# Patient Record
Sex: Male | Born: 1970 | Race: White | Hispanic: No | Smoking: Former smoker
Health system: Southern US, Community
[De-identification: ages and names within clinical notes are randomized; demographics above are authoritative.]

## PROBLEM LIST (undated history)

## (undated) DIAGNOSIS — M1990 Unspecified osteoarthritis, unspecified site: Secondary | ICD-10-CM

## (undated) DIAGNOSIS — G709 Myoneural disorder, unspecified: Secondary | ICD-10-CM

## (undated) HISTORY — PX: KNEE ARTHROSCOPY: SUR90

## (undated) HISTORY — PX: CERVICAL FUSION: SHX112

---

## 2009-03-14 ENCOUNTER — Ambulatory Visit (HOSPITAL_COMMUNITY): Admission: RE | Admit: 2009-03-14 | Discharge: 2009-03-14 | Payer: Self-pay | Admitting: Neurosurgery

## 2010-05-04 LAB — CBC
Hemoglobin: 16.4 g/dL (ref 13.0–17.0)
RBC: 5.12 MIL/uL (ref 4.22–5.81)
WBC: 7.9 10*3/uL (ref 4.0–10.5)

## 2012-10-30 ENCOUNTER — Other Ambulatory Visit: Payer: Self-pay | Admitting: Neurosurgery

## 2012-10-30 DIAGNOSIS — M4712 Other spondylosis with myelopathy, cervical region: Secondary | ICD-10-CM

## 2012-10-31 ENCOUNTER — Other Ambulatory Visit: Payer: Self-pay | Admitting: Neurosurgery

## 2012-10-31 DIAGNOSIS — Z77018 Contact with and (suspected) exposure to other hazardous metals: Secondary | ICD-10-CM

## 2012-11-02 ENCOUNTER — Ambulatory Visit
Admission: RE | Admit: 2012-11-02 | Discharge: 2012-11-02 | Disposition: A | Discharge status: Home / Self Care | Payer: Worker's Compensation | Source: Ambulatory Visit | Attending: Neurosurgery | Admitting: Neurosurgery

## 2012-11-02 ENCOUNTER — Other Ambulatory Visit: Payer: Self-pay

## 2012-11-02 DIAGNOSIS — Z77018 Contact with and (suspected) exposure to other hazardous metals: Secondary | ICD-10-CM

## 2012-11-02 DIAGNOSIS — M4712 Other spondylosis with myelopathy, cervical region: Secondary | ICD-10-CM

## 2012-11-29 ENCOUNTER — Other Ambulatory Visit: Payer: Self-pay | Admitting: Neurosurgery

## 2012-11-29 DIAGNOSIS — M4712 Other spondylosis with myelopathy, cervical region: Secondary | ICD-10-CM

## 2012-12-08 ENCOUNTER — Ambulatory Visit
Admission: RE | Admit: 2012-12-08 | Discharge: 2012-12-08 | Disposition: A | Discharge status: Home / Self Care | Payer: Worker's Compensation | Source: Ambulatory Visit | Attending: Neurosurgery | Admitting: Neurosurgery

## 2012-12-08 VITALS — BP 132/65 | HR 68

## 2012-12-08 DIAGNOSIS — M4712 Other spondylosis with myelopathy, cervical region: Secondary | ICD-10-CM

## 2012-12-08 MED ORDER — DIAZEPAM 5 MG PO TABS
10.0000 mg | ORAL_TABLET | Freq: Once | ORAL | Status: AC
  Administered 2012-12-08: 10 mg via ORAL

## 2012-12-08 MED ORDER — MEPERIDINE HCL 100 MG/ML IJ SOLN
100.0000 mg | Freq: Once | INTRAMUSCULAR | Status: AC
  Administered 2012-12-08: 100 mg via INTRAMUSCULAR

## 2012-12-08 MED ORDER — ONDANSETRON HCL 4 MG/2ML IJ SOLN
4.0000 mg | Freq: Once | INTRAMUSCULAR | Status: AC
  Administered 2012-12-08: 4 mg via INTRAMUSCULAR

## 2012-12-08 MED ORDER — IOHEXOL 300 MG/ML  SOLN
10.0000 mL | Freq: Once | INTRAMUSCULAR | Status: AC | PRN
  Administered 2012-12-08: 10 mL via INTRAVENOUS

## 2014-05-21 ENCOUNTER — Other Ambulatory Visit: Payer: Self-pay | Admitting: Neurosurgery

## 2014-05-27 MED ORDER — CEFAZOLIN SODIUM-DEXTROSE 2-3 GM-% IV SOLR
2.0000 g | INTRAVENOUS | Status: AC
  Administered 2014-05-28: 2 g via INTRAVENOUS

## 2014-05-28 ENCOUNTER — Encounter (HOSPITAL_COMMUNITY)
Admission: RE | Disposition: A | Discharge status: Home / Self Care | Payer: Self-pay | Source: Ambulatory Visit | Attending: Neurosurgery

## 2014-05-28 ENCOUNTER — Ambulatory Visit (HOSPITAL_COMMUNITY)
Admission: RE | Admit: 2014-05-28 | Discharge: 2014-05-28 | Disposition: A | Discharge status: Home / Self Care | Payer: Worker's Compensation | Source: Ambulatory Visit | Attending: Neurosurgery | Admitting: Neurosurgery

## 2014-05-28 ENCOUNTER — Ambulatory Visit (HOSPITAL_COMMUNITY): Payer: Worker's Compensation | Admitting: Certified Registered Nurse Anesthetist

## 2014-05-28 ENCOUNTER — Encounter (HOSPITAL_COMMUNITY): Payer: Self-pay

## 2014-05-28 DIAGNOSIS — G5601 Carpal tunnel syndrome, right upper limb: Secondary | ICD-10-CM | POA: Insufficient documentation

## 2014-05-28 DIAGNOSIS — M4722 Other spondylosis with radiculopathy, cervical region: Secondary | ICD-10-CM | POA: Insufficient documentation

## 2014-05-28 DIAGNOSIS — Z87891 Personal history of nicotine dependence: Secondary | ICD-10-CM | POA: Insufficient documentation

## 2014-05-28 DIAGNOSIS — G5602 Carpal tunnel syndrome, left upper limb: Secondary | ICD-10-CM | POA: Insufficient documentation

## 2014-05-28 DIAGNOSIS — M4802 Spinal stenosis, cervical region: Secondary | ICD-10-CM | POA: Insufficient documentation

## 2014-05-28 HISTORY — DX: Myoneural disorder, unspecified: G70.9

## 2014-05-28 HISTORY — PX: CARPAL TUNNEL RELEASE: SHX101

## 2014-05-28 HISTORY — DX: Unspecified osteoarthritis, unspecified site: M19.90

## 2014-05-28 SURGERY — CARPAL TUNNEL RELEASE
Anesthesia: Monitor Anesthesia Care | Laterality: Right

## 2014-05-28 MED ORDER — 0.9 % SODIUM CHLORIDE (POUR BTL) OPTIME
TOPICAL | Status: DC | PRN
  Administered 2014-05-28: 1000 mL

## 2014-05-28 MED ORDER — NEOSTIGMINE METHYLSULFATE 10 MG/10ML IV SOLN
INTRAVENOUS | Status: AC
  Filled 2014-05-28: qty 1

## 2014-05-28 MED ORDER — FENTANYL CITRATE 0.05 MG/ML IJ SOLN
INTRAMUSCULAR | Status: AC
  Filled 2014-05-28: qty 5

## 2014-05-28 MED ORDER — OXYCODONE-ACETAMINOPHEN 5-325 MG PO TABS
ORAL_TABLET | ORAL | Status: AC
  Filled 2014-05-28: qty 2

## 2014-05-28 MED ORDER — FENTANYL CITRATE 0.05 MG/ML IJ SOLN
INTRAMUSCULAR | Status: DC | PRN
  Administered 2014-05-28 (×4): 50 ug via INTRAVENOUS

## 2014-05-28 MED ORDER — LIDOCAINE HCL (PF) 1 % IJ SOLN
INTRAMUSCULAR | Status: DC | PRN
  Administered 2014-05-28: 7 mL

## 2014-05-28 MED ORDER — MIDAZOLAM HCL 5 MG/5ML IJ SOLN
INTRAMUSCULAR | Status: DC | PRN
  Administered 2014-05-28: 2 mg via INTRAVENOUS

## 2014-05-28 MED ORDER — PROMETHAZINE HCL 25 MG/ML IJ SOLN: 6.2500 mg | INTRAMUSCULAR | Status: DC | PRN

## 2014-05-28 MED ORDER — LACTATED RINGERS IV SOLN
INTRAVENOUS | Status: DC
  Administered 2014-05-28: 10:00:00 via INTRAVENOUS

## 2014-05-28 MED ORDER — MIDAZOLAM HCL 2 MG/2ML IJ SOLN
INTRAMUSCULAR | Status: AC
  Filled 2014-05-28: qty 2

## 2014-05-28 MED ORDER — PROPOFOL 10 MG/ML IV BOLUS
INTRAVENOUS | Status: DC | PRN
  Administered 2014-05-28: 20 mg via INTRAVENOUS

## 2014-05-28 MED ORDER — LACTATED RINGERS IV SOLN
INTRAVENOUS | Status: DC | PRN
  Administered 2014-05-28: 10:00:00 via INTRAVENOUS

## 2014-05-28 MED ORDER — OXYCODONE-ACETAMINOPHEN 5-325 MG PO TABS
2.0000 | ORAL_TABLET | Freq: Once | ORAL | Status: AC
  Administered 2014-05-28: 2 via ORAL

## 2014-05-28 MED ORDER — HYDROMORPHONE HCL 1 MG/ML IJ SOLN: 0.2500 mg | INTRAMUSCULAR | Status: DC | PRN

## 2014-05-28 MED ORDER — PHENYLEPHRINE HCL 10 MG/ML IJ SOLN
INTRAMUSCULAR | Status: AC
  Filled 2014-05-28: qty 1

## 2014-05-28 MED ORDER — CEFAZOLIN SODIUM-DEXTROSE 2-3 GM-% IV SOLR
INTRAVENOUS | Status: AC
  Filled 2014-05-28: qty 50

## 2014-05-28 MED ORDER — LIDOCAINE HCL (CARDIAC) 20 MG/ML IV SOLN
INTRAVENOUS | Status: AC
  Filled 2014-05-28: qty 5

## 2014-05-28 MED ORDER — PROPOFOL INFUSION 10 MG/ML OPTIME
INTRAVENOUS | Status: DC | PRN
  Administered 2014-05-28: 30 ug/kg/min via INTRAVENOUS

## 2014-05-28 MED ORDER — GLYCOPYRROLATE 0.2 MG/ML IJ SOLN
INTRAMUSCULAR | Status: AC
  Filled 2014-05-28: qty 2

## 2014-05-28 SURGICAL SUPPLY — 37 items
BANDAGE ELASTIC 4 VELCRO ST LF (GAUZE/BANDAGES/DRESSINGS) ×3 IMPLANT
BANDAGE GAUZE 4  KLING STR (GAUZE/BANDAGES/DRESSINGS) ×3 IMPLANT
BLADE SURG 15 STRL LF DISP TIS (BLADE) ×1 IMPLANT
BLADE SURG 15 STRL SS (BLADE) ×2
BNDG GAUZE ELAST 4 BULKY (GAUZE/BANDAGES/DRESSINGS) ×3 IMPLANT
CORDS BIPOLAR (ELECTRODE) ×3 IMPLANT
DECANTER SPIKE VIAL GLASS SM (MISCELLANEOUS) ×3 IMPLANT
DRAPE EXTREMITY T 121X128X90 (DRAPE) ×3 IMPLANT
DRSG TELFA 3X8 NADH (GAUZE/BANDAGES/DRESSINGS) ×3 IMPLANT
GAUZE SPONGE 4X4 12PLY STRL (GAUZE/BANDAGES/DRESSINGS) ×3 IMPLANT
GAUZE SPONGE 4X4 16PLY XRAY LF (GAUZE/BANDAGES/DRESSINGS) ×3 IMPLANT
GLOVE BIO SURGEON STRL SZ8 (GLOVE) ×3 IMPLANT
GLOVE BIOGEL PI IND STRL 8.5 (GLOVE) ×1 IMPLANT
GLOVE BIOGEL PI INDICATOR 8.5 (GLOVE) ×2
GLOVE EXAM NITRILE LRG STRL (GLOVE) IMPLANT
GLOVE EXAM NITRILE MD LF STRL (GLOVE) IMPLANT
GLOVE EXAM NITRILE XL STR (GLOVE) IMPLANT
GLOVE EXAM NITRILE XS STR PU (GLOVE) IMPLANT
GOWN STRL REUS W/ TWL LRG LVL3 (GOWN DISPOSABLE) IMPLANT
GOWN STRL REUS W/ TWL XL LVL3 (GOWN DISPOSABLE) IMPLANT
GOWN STRL REUS W/TWL 2XL LVL3 (GOWN DISPOSABLE) IMPLANT
GOWN STRL REUS W/TWL LRG LVL3 (GOWN DISPOSABLE)
GOWN STRL REUS W/TWL XL LVL3 (GOWN DISPOSABLE)
KIT BASIN OR (CUSTOM PROCEDURE TRAY) ×3 IMPLANT
KIT ROOM TURNOVER OR (KITS) ×3 IMPLANT
NEEDLE HYPO 25X1 1.5 SAFETY (NEEDLE) ×3 IMPLANT
NS IRRIG 1000ML POUR BTL (IV SOLUTION) ×3 IMPLANT
PACK SURGICAL SETUP 50X90 (CUSTOM PROCEDURE TRAY) ×3 IMPLANT
PAD ARMBOARD 7.5X6 YLW CONV (MISCELLANEOUS) ×9 IMPLANT
STOCKINETTE 4X48 STRL (DRAPES) ×3 IMPLANT
SUT ETHILON 3 0 PS 1 (SUTURE) ×3 IMPLANT
SYR BULB 3OZ (MISCELLANEOUS) ×3 IMPLANT
SYR CONTROL 10ML LL (SYRINGE) ×3 IMPLANT
TOWEL OR 17X24 6PK STRL BLUE (TOWEL DISPOSABLE) ×3 IMPLANT
TOWEL OR 17X26 10 PK STRL BLUE (TOWEL DISPOSABLE) ×3 IMPLANT
UNDERPAD 30X30 INCONTINENT (UNDERPADS AND DIAPERS) ×3 IMPLANT
WATER STERILE IRR 1000ML POUR (IV SOLUTION) ×3 IMPLANT

## 2014-05-28 NOTE — Discharge Summary (Signed)
Physician Discharge Summary  Patient ID: Patsy BaltimoreBilly R Gentile MRN: [id number] DOB/AGE: Oct 09, 1970 44 y.o.  Admit date: 05/28/2014 Discharge date: 05/28/2014  Admission Diagnoses: Right carpal tunnel syndrome  Discharge Diagnoses: Same Active Problems:   * No active hospital problems. *   Discharged Condition: good  Hospital Course: Uncomplicated right carpal tunnel release  Consults: None  Significant Diagnostic Studies: None  Treatments: surgery: right carpal tunnel release  Discharge Exam: Blood pressure 132/66, pulse 63, temperature 98 F (36.7 C), temperature source Oral, resp. rate 13, height 6' (1.829 m), weight 77.111 kg (170 lb), SpO2 98 %. Neurologic: Alert and oriented X 3, normal strength and tone. Normal symmetric reflexes. Normal coordination and gait Wound:CDI  Disposition: Home     Medication List    ASK your doctor about these medications        diazepam 5 MG tablet  Commonly known as:  VALIUM  Take 5 mg by mouth at bedtime as needed (sleep).     morphine 15 MG 12 hr tablet  Commonly known as:  MS CONTIN  Take 15 mg by mouth every 12 (twelve) hours.     oxyCODONE-acetaminophen 5-325 MG per tablet  Commonly known as:  PERCOCET/ROXICET  Take 1 tablet by mouth every 4 (four) hours as needed for severe pain (pain).         Signed: Dorian HeckleSTERN,Shawnee Higham D, MD 05/28/2014, 3:47 PM

## 2014-05-28 NOTE — Brief Op Note (Signed)
05/28/2014  11:49 AM  PATIENT:  Louis Young  44 y.o. male  PRE-OPERATIVE DIAGNOSIS:  Bilateral carpal tunnel syndrome  POST-OPERATIVE DIAGNOSIS: Bilateral carpal tunnel syndrome  PROCEDURE:  Procedure(s) with comments: RIGHT CARPAL TUNNEL RELEASE (Right) - RIGHT CARPAL TUNNEL RELEASE  SURGEON:  Surgeon(s) and Role:    * Maeola HarmanJoseph Edgerrin Correia, MD - Primary  PHYSICIAN ASSISTANT:   ASSISTANTS: none   ANESTHESIA:   IV sedation  EBL:  Total I/O In: 700 [I.V.:700] Out: -   BLOOD ADMINISTERED:none  DRAINS: none   LOCAL MEDICATIONS USED:  LIDOCAINE   SPECIMEN:  No Specimen  DISPOSITION OF SPECIMEN:  N/A  COUNTS:  YES  TOURNIQUET:  * No tourniquets in log *  DICTATION:   Patient was given intravenous sedation and positioned with his right arm on the arm board.  His hand and arm were prepped and draped with betadine scrub and paint and sterile stockinet.  Right wrist was infiltrated with lidocaine and an incision was made over a length of 2 cm in line with the fourth ray.  The flexor retinaculum was incised and the carpal tunnel was decompressed.  Decompression was carried into the volar wrist and into the distal palm.  Hemostasis was assured. The wound was irrigated and closed with 3-0 nylon vertical mattress stitches and dressed with a sterile occlusive dressing with Bacitracin and Telfa, fluff gauze, Kerlix and cling wrap. Patient was taken to recovery in stable and satisfactory condition. Counts were correct at the end of the case.  PLAN OF CARE: Discharge to home after PACU  PATIENT DISPOSITION:  PACU - hemodynamically stable.   Delay start of Pharmacological VTE agent (>24hrs) due to surgical blood loss or risk of bleeding: yes

## 2014-05-28 NOTE — Transfer of Care (Signed)
Immediate Anesthesia Transfer of Care Note  Patient: Louis Young  Procedure(s) Performed: Procedure(s) with comments: RIGHT CARPAL TUNNEL RELEASE (Right) - RIGHT CARPAL TUNNEL RELEASE  Patient Location: PACU  Anesthesia Type:MAC  Level of Consciousness: awake, alert , oriented and patient cooperative  Airway & Oxygen Therapy: Patient Spontanous Breathing and Patient connected to nasal cannula oxygen  Post-op Assessment: Report given to RN and Post -op Vital signs reviewed and stable  Post vital signs: Reviewed  Last Vitals:  Filed Vitals:   05/28/14 0910  BP: 150/84  Pulse: 70  Temp: 36.6 C  Resp: 18    Complications: No apparent anesthesia complications

## 2014-05-28 NOTE — Interval H&P Note (Signed)
History and Physical Interval Note:  05/28/2014 7:17 AM  Louis Young  has presented today for surgery, with the diagnosis of Bilateral carpal tunnel release  The various methods of treatment have been discussed with the patient and family. After consideration of risks, benefits and other options for treatment, the patient has consented to  Procedure(s) with comments: RIGHT CARPAL TUNNEL RELEASE (Right) - RIGHT CARPAL TUNNEL RELEASE as a surgical intervention .  [name] history has been reviewed, patient examined, no change in status, stable for surgery.  I have reviewed the patient's chart and labs.  Questions were answered to the patient's satisfaction.     Seryna Marek D

## 2014-05-28 NOTE — Anesthesia Postprocedure Evaluation (Signed)
  Anesthesia Post-op Note  Patient: Louis Young  Procedure(s) Performed: Procedure(s) with comments: RIGHT CARPAL TUNNEL RELEASE (Right) - RIGHT CARPAL TUNNEL RELEASE  Patient Location: PACU  Anesthesia Type:MAC  Level of Consciousness: awake  Airway and Oxygen Therapy: Patient Spontanous Breathing  Post-op Pain: mild  Post-op Assessment: Post-op Vital signs reviewed  Post-op Vital Signs: Reviewed  Last Vitals:  Filed Vitals:   05/28/14 1210  BP: 132/66  Pulse:   Temp:   Resp: 13    Complications: No apparent anesthesia complications

## 2014-05-28 NOTE — Op Note (Signed)
05/28/2014  11:49 AM  PATIENT:  Louis Young  43 y.o. male  PRE-OPERATIVE DIAGNOSIS:  Bilateral carpal tunnel syndrome  POST-OPERATIVE DIAGNOSIS: Bilateral carpal tunnel syndrome  PROCEDURE:  Procedure(s) with comments: RIGHT CARPAL TUNNEL RELEASE (Right) - RIGHT CARPAL TUNNEL RELEASE  SURGEON:  Surgeon(s) and Role:    * Eraina Winnie, MD - Primary  PHYSICIAN ASSISTANT:   ASSISTANTS: none   ANESTHESIA:   IV sedation  EBL:  Total I/O In: 700 [I.V.:700] Out: -   BLOOD ADMINISTERED:none  DRAINS: none   LOCAL MEDICATIONS USED:  LIDOCAINE   SPECIMEN:  No Specimen  DISPOSITION OF SPECIMEN:  N/A  COUNTS:  YES  TOURNIQUET:  * No tourniquets in log *  DICTATION:   Patient was given intravenous sedation and positioned with his right arm on the arm board.  His hand and arm were prepped and draped with betadine scrub and paint and sterile stockinet.  Right wrist was infiltrated with lidocaine and an incision was made over a length of 2 cm in line with the fourth ray.  The flexor retinaculum was incised and the carpal tunnel was decompressed.  Decompression was carried into the volar wrist and into the distal palm.  Hemostasis was assured. The wound was irrigated and closed with 3-0 nylon vertical mattress stitches and dressed with a sterile occlusive dressing with Bacitracin and Telfa, fluff gauze, Kerlix and cling wrap. Patient was taken to recovery in stable and satisfactory condition. Counts were correct at the end of the case.  PLAN OF CARE: Discharge to home after PACU  PATIENT DISPOSITION:  PACU - hemodynamically stable.   Delay start of Pharmacological VTE agent (>24hrs) due to surgical blood loss or risk of bleeding: yes  

## 2014-05-28 NOTE — H&P (Signed)
Patient ID:   [id number] Patient: Louis Young  Date of Birth: Jun 21, 1970 Visit Type: Office Visit   Date: 05/20/2014 03:45 PM Provider: Danae OrleansJoseph D. Venetia MaxonStern MD   This 44 year old male presents for neck pain.  History of Present Illness: 1.  neck pain  Patient continues to complain of severe hand numbness bilaterally.  He had EMG testing in September 2014 which showed severe bilateral carpal tunnel syndrome.  He wishes to proceed with carpal tunnel release.  He no longer has insurance coverage.  He says his hands are bothering him all the time.  I recommended that we proceed with staged carpal tunnel release initially we'll perform the right side subsequently the left and has scheduled this for this month.  Risks and benefits of this surgery were discussed in detail with the patient and he wishes to proceed.      Medical/Surgical/Interim History Reviewed, no change.  Last detailed document date:10/16/2013.   PAST MEDICAL HISTORY, SURGICAL HISTORY, FAMILY HISTORY, SOCIAL HISTORY AND REVIEW OF SYSTEMS I have reviewed the patient's past medical, surgical, family and social history as well as the comprehensive review of systems as included on the [facility] history form dated 10/16/2013, which I have signed.  Family History: Reviewed, no changes.  Last detailed document: 10/16/2013.   Social History: Tobacco use reviewed. Reviewed, no changes. Last detailed document date: 10/16/2013.      MEDICATIONS(added, continued or stopped this visit): Started Medication Directions Instruction Stopped  03/05/2014 diazepam 5 mg tablet take 1 tablet by oral route QHS as needed    03/09/2014 morphine sulfate ER 15mg  BUCCAL 1 PO BID (DNF 03/09/14)    04/08/2014 morphine sulfate ER 15mg  BUCCAL 1 PO BID (DNF 04/08/14)    05/06/2014 morphine sulfate ER 15mg  BUCCAL 1 PO BID (DNF 05/06/14)    02/07/2014 morphine sulfate ER 15mg  BUCCAL 1 PO BID (DNF 02/07/14)    03/14/2014  Percocet 7.5 mg-325 mg tablet take 1 tablet by oral route  every 6-8 hours as needed for break though pain only, NTE 4/day, DNF 03/14/14    04/13/2014 Percocet 7.5 mg-325 mg tablet take 1 tablet by oral route  every 6-8 hours as needed for break though pain only, NTE 4/day, DNF 04/13/14    05/11/2014 Percocet 7.5 mg-325 mg tablet take 1 tablet by oral route  every 6-8 hours as needed for break though pain only, NTE 4/day, DNF 05/11/14    03/05/2014 Topamax 50 mg tablet take 1 tablet by oral route 2 times every day       ALLERGIES: Ingredient Reaction Medication Name Comment  NO KNOWN ALLERGIES     No known allergies.    Vitals Date Temp F BP Pulse Ht In Wt Lb BMI BSA Pain Score  05/20/2014  176/105 99 72 166 22.51  6/10      IMPRESSION Patient wishes to have his severe bilateral carpal tunnel syndrome surgically addressed.  Completed Orders (this encounter) Order Details Reason Side Interpretation Result Initial Treatment Date Region  Hypertension education Continue to monitor blood pressure.  If blood pressure remains elevated, contact your primary care physician        Cervical Spine- AP/Lat/Flex/Ex      05/20/2014 All Levels to All Levels   Assessment/Plan # Detail Type Description   1. Assessment Bilateral carpal tunnel syndrome (G56.01).       2. Assessment Carpal tunnel syndrome, left upper limb (G56.02).       3. Assessment Cervicalgia (M54.2).  4. Assessment Elevated blood-pressure reading, w/o diagnosis of htn (R03.0).       5. Assessment Spinal stenosis, cervical region (M48.02).       6. Assessment Cervical spondylosis with radiculopathy (M47.22).         Pain Assessment/Treatment Pain Scale: 6/10. Method: Numeric Pain Intensity Scale. Location: neck, arm. Onset: 06/27/2008. Duration: varies. Quality: discomforting. Pain Assessment/Treatment follow-up plan of care: Patient taking medications as prescribed..  Fall Risk Plan The patient has not  fallen in the last year.  Right followed by left carpal tunnel release on a staged basis.  Orders: Diagnostic Procedures: Assessment Procedure  G56.01 Carpel Tunnel Release - right, then followed by left in staged fashion  M47.22 Cervical Spine- AP/Lat/Flex/Ex  M54.2 Cervical Spine- AP/Lat/Flex/Ex  Instruction(s)/Education: Assessment Instruction  R03.0 Hypertension education             Provider:  Danae Orleans. Venetia Maxon MD  05/25/2014 03:19 PM Dictation edited by: Danae Orleans. Venetia Maxon    CC Providers: Maeola Harman MD [address] [place], [place] [address]              Electronically signed by Danae Orleans. Venetia Maxon MD on 05/25/2014 03:19 PM

## 2014-05-28 NOTE — Anesthesia Preprocedure Evaluation (Signed)
Anesthesia Evaluation  Patient identified by MRN, date of birth, ID band Patient awake    Reviewed: Allergy & Precautions, NPO status , Patient's Chart, lab work & pertinent test results  Airway Mallampati: II  TM Distance: >3 FB Neck ROM: Full    Dental   Pulmonary former smoker,  breath sounds clear to auscultation        Cardiovascular negative cardio ROS  Rhythm:Regular Rate:Normal     Neuro/Psych    GI/Hepatic negative GI ROS, Neg liver ROS,   Endo/Other  negative endocrine ROS  Renal/GU negative Renal ROS     Musculoskeletal   Abdominal   Peds  Hematology   Anesthesia Other Findings   Reproductive/Obstetrics                             Anesthesia Physical Anesthesia Plan  ASA: I  Anesthesia Plan: MAC   Post-op Pain Management:    Induction: Intravenous  Airway Management Planned: Simple Face Mask  Additional Equipment:   Intra-op Plan:   Post-operative Plan:   Informed Consent: I have reviewed the patients History and Physical, chart, labs and discussed the procedure including the risks, benefits and alternatives for the proposed anesthesia with the patient or authorized representative who has indicated his/her understanding and acceptance.   Dental advisory given  Plan Discussed with: CRNA and Anesthesiologist  Anesthesia Plan Comments:         Anesthesia Quick Evaluation

## 2014-05-28 NOTE — Discharge Instructions (Signed)
General Anesthesia, Care After Refer to this sheet in the next few weeks. These instructions provide you with information on caring for yourself after your procedure. Your health care provider may also give you more specific instructions. Your treatment has been planned according to current medical practices, but problems sometimes occur. Call your health care provider if you have any problems or questions after your procedure. WHAT TO EXPECT AFTER THE PROCEDURE After the procedure, it is typical to experience:  Sleepiness.  Nausea and vomiting. HOME CARE INSTRUCTIONS  For the first 24 hours after general anesthesia:  Have a responsible person with you.  Do not drive a car. If you are alone, do not take public transportation.  Do not drink alcohol.  Do not take medicine that has not been prescribed by your health care provider.  Do not sign important papers or make important decisions.  You may resume a normal diet and activities as directed by your health care provider.  Change bandages (dressings) as directed.  If you have questions or problems that seem related to general anesthesia, call the hospital and ask for the anesthetist or anesthesiologist on call. SEEK MEDICAL CARE IF:  You have nausea and vomiting that continue the day after anesthesia.  You develop a rash. SEEK IMMEDIATE MEDICAL CARE IF:   You have difficulty breathing.  You have chest pain.  You have any allergic problems. Document Released: 05/10/2000 Document Revised: 02/06/2013 Document Reviewed: 08/17/2012 [facility] Patient Information 2015 [employer] [place]. This information is not intended to replace advice given to you by your health care provider. Make sure you discuss any questions you have with your health care provider.  Keep dressing clean and dry til follow up.  Resume regular diet.  Keep hand elevated above you heart for a couple of days.  Follow up appt in 10 days with Dr. Venetia MaxonStern.  Call MD  with fever >100.5, drainage from dressing, or uncontrolled pain after pain medicine.

## 2014-06-01 ENCOUNTER — Encounter (HOSPITAL_COMMUNITY): Payer: Self-pay | Admitting: Neurosurgery

## 2014-06-18 ENCOUNTER — Other Ambulatory Visit: Payer: Self-pay | Admitting: Neurosurgery

## 2014-06-28 ENCOUNTER — Encounter (HOSPITAL_COMMUNITY): Admission: RE | Payer: Self-pay | Source: Ambulatory Visit

## 2014-06-28 ENCOUNTER — Ambulatory Visit (HOSPITAL_COMMUNITY): Admission: RE | Admit: 2014-06-28 | Payer: Self-pay | Source: Ambulatory Visit | Admitting: Neurosurgery

## 2014-06-28 SURGERY — CARPAL TUNNEL RELEASE
Anesthesia: LOCAL | Laterality: Left

## 2015-08-12 IMAGING — CR DG ORBITS FOR FOREIGN BODY
2 series · 2 of 2 positions shown · non-contrast
Comparison: None.

CLINICAL DATA: Metal working/exposure; clearance prior to MRI

EXAM:
ORBITS FOR FOREIGN BODY - 2 VIEW

[view not recorded (1 of 2)]
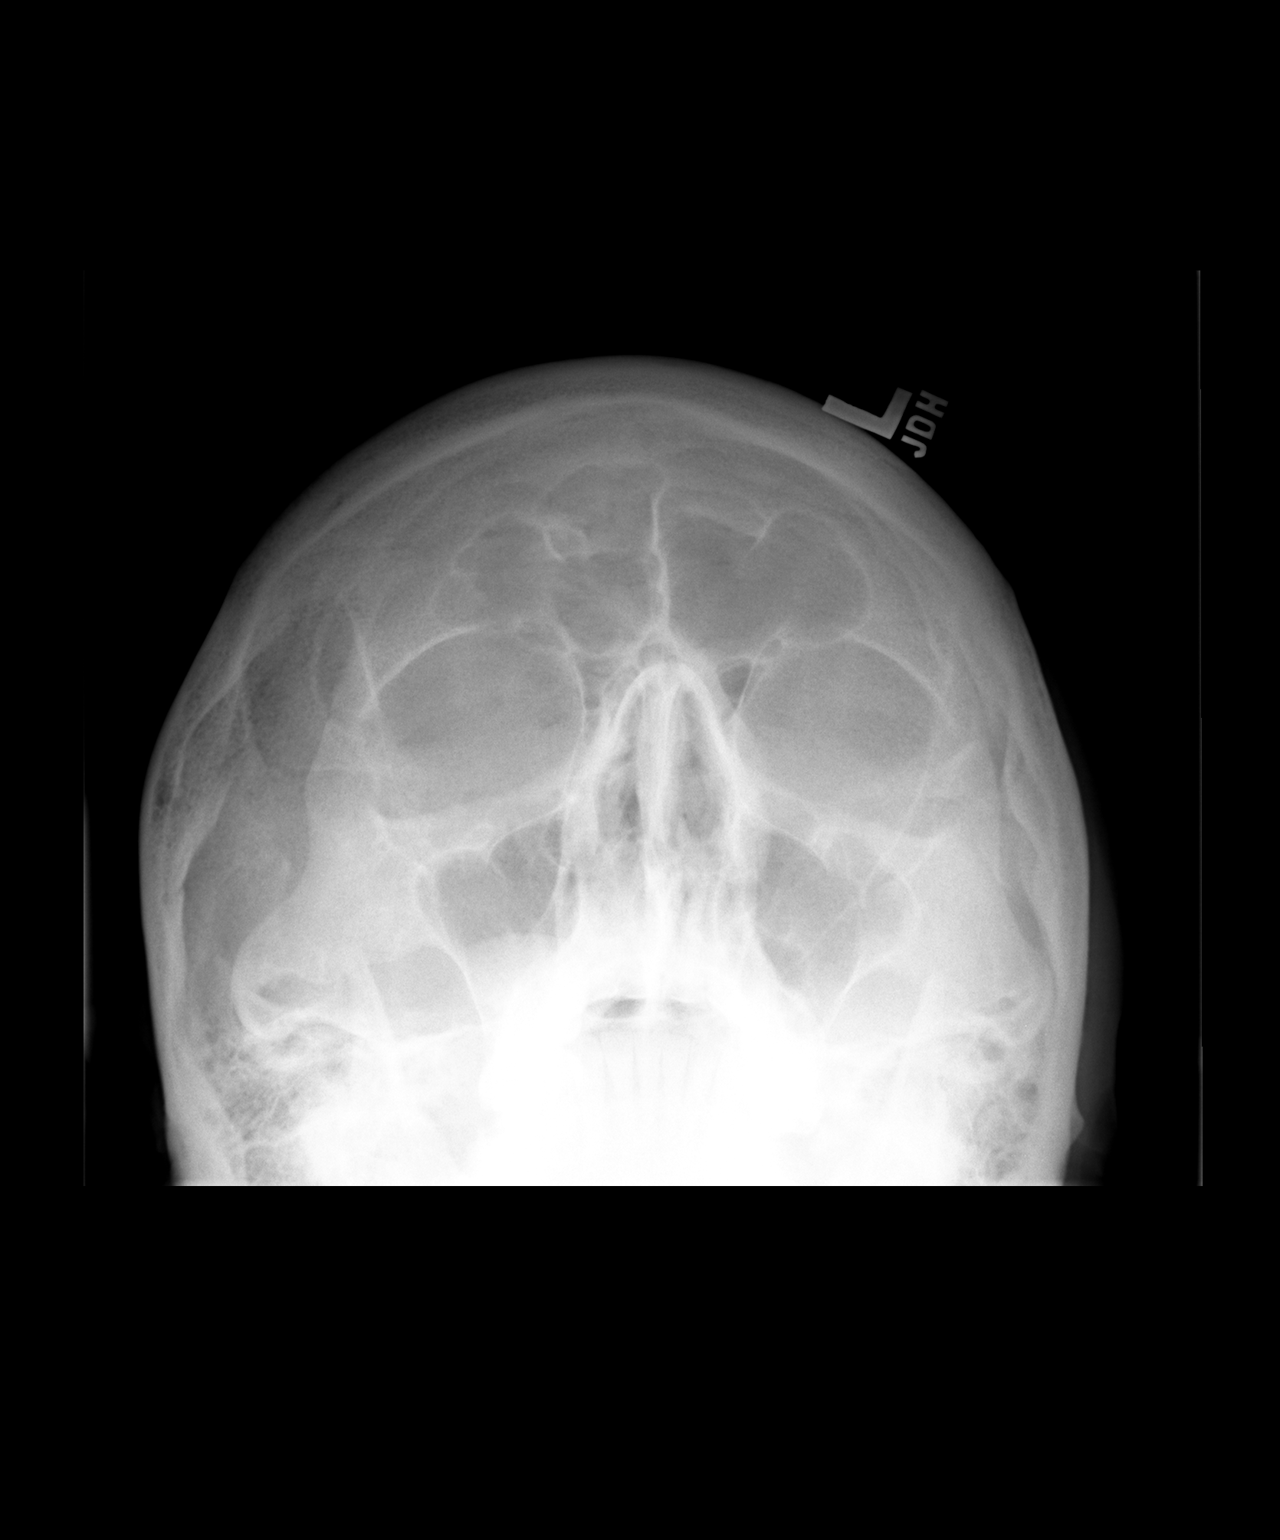

[view not recorded (2 of 2)]
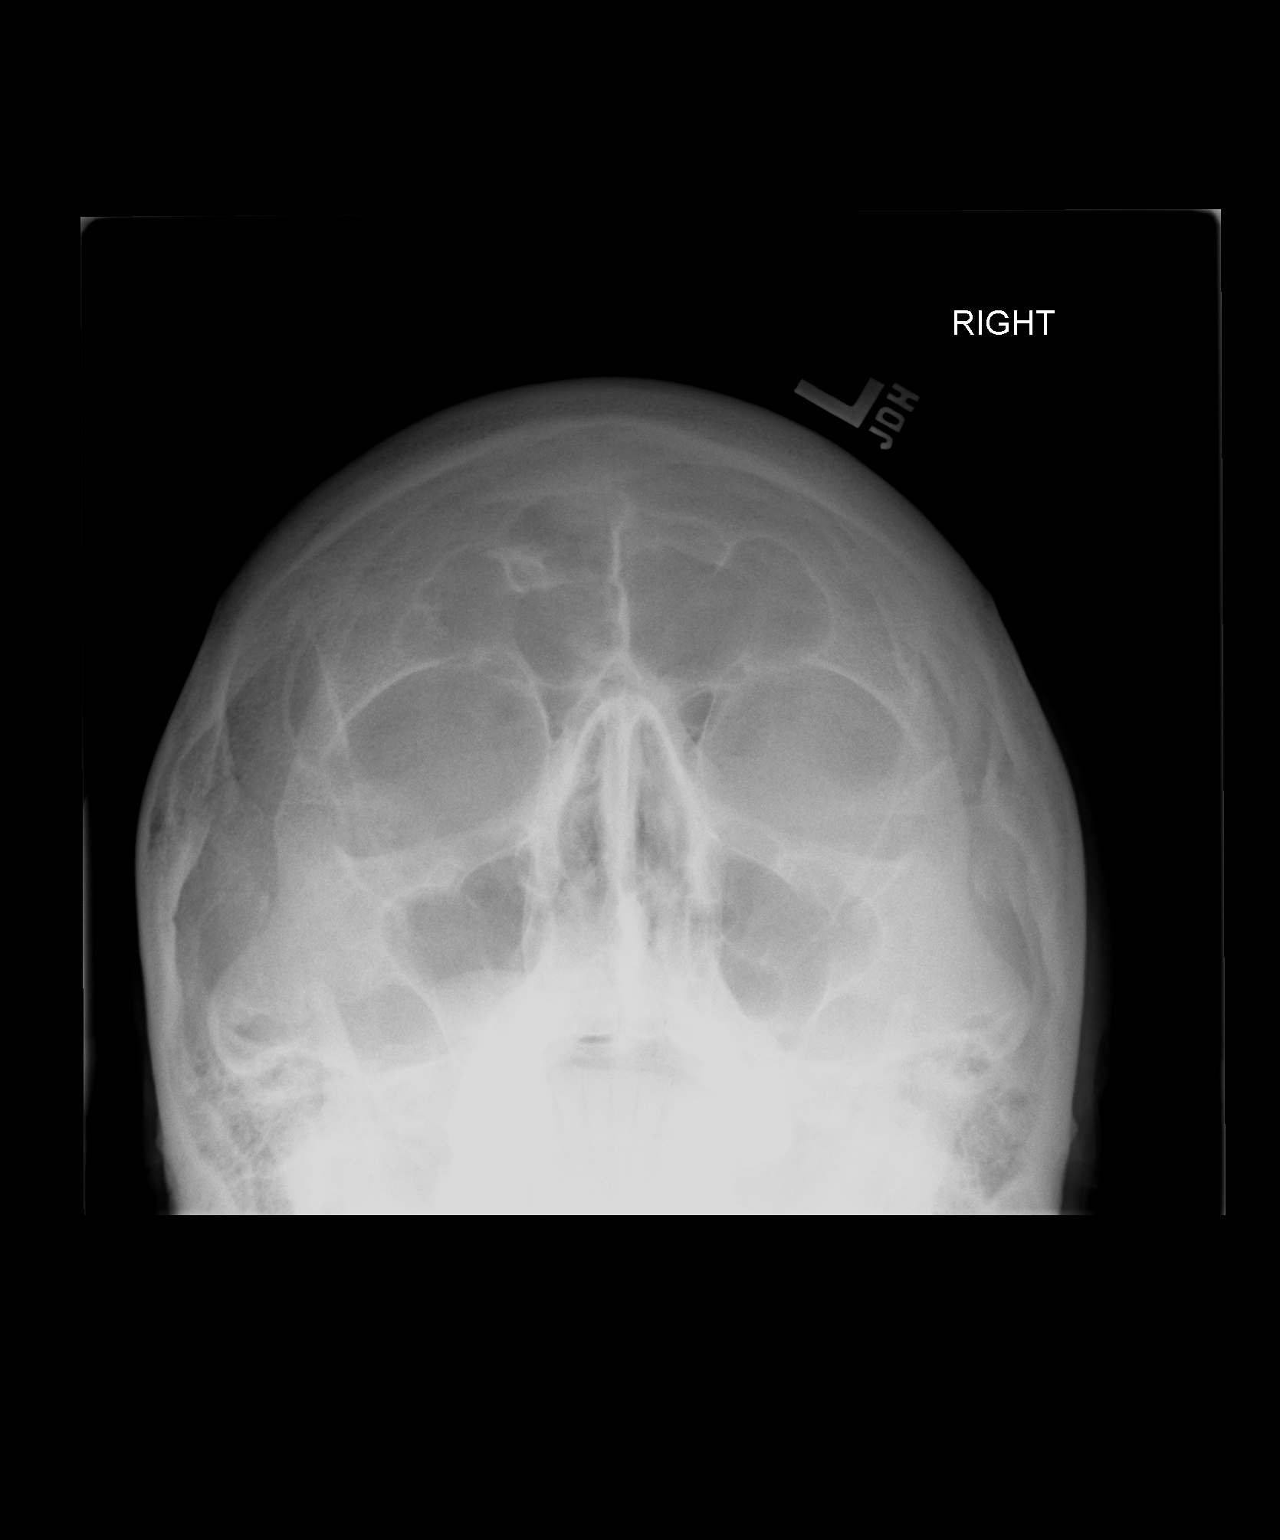

[2 of 2 positions shown; findings below may reference images not displayed]

FINDINGS: There is no evidence of metallic foreign body within the orbits. No
significant bone abnormality identified.
IMPRESSION: No evidence of metallic foreign body within the orbits.
